# Patient Record
Sex: Female | Born: 1995 | Race: Black or African American | Hispanic: No | Marital: Single | Smoking: Never smoker
Health system: Southern US, Community
[De-identification: ages and names within clinical notes are randomized; demographics above are authoritative.]

## PROBLEM LIST (undated history)

## (undated) DIAGNOSIS — J45909 Unspecified asthma, uncomplicated: Secondary | ICD-10-CM

---
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
## 2017-09-23 ENCOUNTER — Encounter (HOSPITAL_COMMUNITY): Payer: Self-pay | Admitting: Emergency Medicine

## 2017-09-23 ENCOUNTER — Emergency Department (HOSPITAL_COMMUNITY)
Admission: EM | Admit: 2017-09-23 | Discharge: 2017-09-23 | Disposition: A | Discharge status: Home / Self Care | Payer: Self-pay | Attending: Emergency Medicine | Admitting: Emergency Medicine

## 2017-09-23 DIAGNOSIS — K0889 Other specified disorders of teeth and supporting structures: Secondary | ICD-10-CM | POA: Insufficient documentation

## 2017-09-23 DIAGNOSIS — J45909 Unspecified asthma, uncomplicated: Secondary | ICD-10-CM | POA: Insufficient documentation

## 2017-09-23 DIAGNOSIS — J029 Acute pharyngitis, unspecified: Secondary | ICD-10-CM | POA: Insufficient documentation

## 2017-09-23 LAB — RAPID STREP SCREEN (MED CTR MEBANE ONLY): Streptococcus, Group A Screen (Direct): NEGATIVE

## 2017-09-23 MED ORDER — DEXAMETHASONE SODIUM PHOSPHATE 10 MG/ML IJ SOLN
10.0000 mg | Freq: Once | INTRAMUSCULAR | Status: AC
  Administered 2017-09-23: 10 mg via INTRAMUSCULAR
  Filled 2017-09-23: qty 1

## 2017-09-23 MED ORDER — PENICILLIN V POTASSIUM 500 MG PO TABS: 500.0000 mg | ORAL_TABLET | Freq: Four times a day (QID) | ORAL | 0 refills | Status: AC

## 2017-09-23 NOTE — ED Notes (Signed)
Pt departed in NAD, refused use of wheelchair.  

## 2017-09-23 NOTE — ED Triage Notes (Signed)
Pt to ER with complaint of sore throat x2 weeks, tonsils are enlarged and reddened. Also reports dental pain to two back molars.

## 2017-09-23 NOTE — ED Provider Notes (Signed)
[facility]-EMERGENCY DEPT Provider Note   CSN: [id number] Arrival date & time: 09/23/17  1748     History   Chief Complaint Chief Complaint  Patient presents with  . Sore Throat    HPI Jeanette Mcneil is a 21 y.o. female, with no significant past medical history, presents to ED for evaluation of sore throat for the past 2 weeks. She also has been having sharp pain in her back wisdom teeth on the lower gumline for the past several months. She was told by her dentist that she needs to have these removed however, her parents do not want her to. States that she also been having rhinorrhea. She denies any sick contacts with similar symptoms, ever, trouble breathing, trouble swallowing, lip swelling, neck pain, rashes. She has not tried any medications to help with pain, however she states that she has had some left over penicillin tablets that she took for a day.  HPI    History reviewed. No pertinent past medical history.  Patient Active Problem List   Diagnosis Date Noted  . ALLERGIC RHINITIS WITH CONJUNCTIVITIS 06/16/2008  . ASTHMA 06/16/2008  . ECZEMA 06/16/2008    History reviewed. No pertinent surgical history.  OB History    No data available       Home Medications    Prior to Admission medications   Medication Sig Start Date End Date Taking? Authorizing Provider  albuterol (PROVENTIL HFA;VENTOLIN HFA) 108 (90 BASE) MCG/ACT inhaler Inhale 1-2 puffs into the lungs every 6 (six) hours as needed for wheezing or shortness of breath. 03/14/14   Loren Racer, MD  ibuprofen (ADVIL,MOTRIN) 600 MG tablet Take 1 tablet (600 mg total) by mouth 3 (three) times daily after meals. 03/14/14   Loren Racer, MD  penicillin v potassium (VEETID) 500 MG tablet Take 1 tablet (500 mg total) by mouth 4 (four) times daily. 09/23/17 09/30/17  Dietrich Pates, PA-C    Family History No family history on file.  Social History Social History  Substance Use Topics  . Smoking status: Never  Smoker  . Smokeless tobacco: Never Used  . Alcohol use No     Allergies   Patient has no known allergies.   Review of Systems Review of Systems  Constitutional: Negative for chills and fever.  HENT: Positive for dental problem, ear pain, rhinorrhea and sore throat. Negative for congestion, ear discharge, facial swelling, hearing loss, mouth sores, sinus pressure and trouble swallowing.   Eyes: Negative for itching.  Respiratory: Negative for cough, shortness of breath and wheezing.   Gastrointestinal: Negative for vomiting.  Musculoskeletal: Negative for neck pain and neck stiffness.  Skin: Negative for rash.     Physical Exam Updated Vital Signs BP 112/77 (BP Location: Left Arm)   Pulse 79   Temp 97.7 F (36.5 C) (Oral)   Resp 16   SpO2 97%   Physical Exam  Constitutional: She appears well-developed and well-nourished. No distress.  HENT:  Head: Normocephalic and atraumatic.  Mouth/Throat: Uvula is midline. Posterior oropharyngeal edema and posterior oropharyngeal erythema present. No tonsillar exudate.    Tenderness to palpation of bilateral wisdom teeth on the lower gumline. No associated abscess or site of drainage noted. Mild posterior oropharyngeal edema and erythema with no tonsillar exudates. Patient does not appear to be in acute distress. No trismus or drooling present. No pooling of secretions. Patient is tolerating secretions and is not in respiratory distress. No neck pain or tenderness to palpation of the neck. Full active and passive range  of motion of the neck. No evidence of RPA or PTA.  Eyes: Conjunctivae and EOM are normal. No scleral icterus.  Neck: Normal range of motion.  Cardiovascular: Normal rate, regular rhythm and normal heart sounds.   Pulmonary/Chest: Effort normal and breath sounds normal. No respiratory distress.  Neurological: She is alert.  Skin: No rash noted. She is not diaphoretic.  Psychiatric: She has a normal mood and affect.    Nursing note and vitals reviewed.    ED Treatments / Results  Labs (all labs ordered are listed, but only abnormal results are displayed) Labs Reviewed  RAPID STREP SCREEN (NOT AT [facility])  CULTURE, GROUP A STREP [facility])    EKG  EKG Interpretation None       Radiology No results found.  Procedures Procedures (including critical care time)  Medications Ordered in ED Medications  dexamethasone (DECADRON) injection 10 mg (10 mg Intramuscular Given 09/23/17 2003)     Initial Impression / Assessment and Plan / ED Course  I have reviewed the triage vital signs and the nursing notes.  Pertinent labs & imaging results that were available during my care of the patient were reviewed by me and considered in my medical decision making (see chart for details).     Patient presents to ED for evaluation of sore throat for 2 weeks and toothache on bilateral bottom wisdom teeth that have been going on for the past month. States that her wisdom teeth have erupted several months ago by her parents do not want to get them removed. She denies any trouble breathing, trouble swallowing, fevers, cough. She does report associated rhinorrhea. She has not tried any pain medications but did take some of her left over penicillin tablets for 1 day. On physical exam she is nontoxic appearing and in no acute distress. She is tolerating secretions with no trismus or drooling. There is mild posterior oropharyngeal erythema and edema but no tonsillar exudates. No visible dental abscesses. Low suspicion for RPA, PTA or Ludwig's angina as the cause of her symptoms today based on physical exam findings. I suspect a viral pharyngitis as a cause of her sore throat and early dental infection. Will give Decadron here and discharge with penicillin for dental infection, and advise follow up with dentist. NSAIDs as needed for pain. Patient appears stable for discharge at this time. Strict return precautions given.  Final  Clinical Impressions(s) / ED Diagnoses   Final diagnoses:  Viral pharyngitis  Pain, dental    New Prescriptions New Prescriptions   PENICILLIN V POTASSIUM (VEETID) 500 MG TABLET    Take 1 tablet (500 mg total) by mouth 4 (four) times daily.     Dietrich Pates, PA-C 09/23/17 2037    Eber Hong, MD 09/26/17 [phone]

## 2017-09-23 NOTE — Discharge Instructions (Signed)
Please read attached information regarding your condition. Take penicillin 4 times daily for 1 week. Take ibuprofen or Tylenol as needed for pain. Follow-up with dentist listed below for further evaluation. Return to ED for worsening sore throat, trouble breathing, trouble swallowing, lip swelling, trouble moving neck.

## 2017-09-26 LAB — CULTURE, GROUP A STREP (THRC)

## 2017-11-03 ENCOUNTER — Other Ambulatory Visit: Payer: Self-pay

## 2017-11-03 ENCOUNTER — Encounter (HOSPITAL_COMMUNITY): Payer: Self-pay | Admitting: Oncology

## 2017-11-03 ENCOUNTER — Emergency Department (HOSPITAL_COMMUNITY)
Admission: EM | Admit: 2017-11-03 | Discharge: 2017-11-04 | Disposition: A | Discharge status: Home / Self Care | Payer: Medicaid Other | Attending: Emergency Medicine | Admitting: Emergency Medicine

## 2017-11-03 DIAGNOSIS — J45909 Unspecified asthma, uncomplicated: Secondary | ICD-10-CM | POA: Insufficient documentation

## 2017-11-03 DIAGNOSIS — Z79899 Other long term (current) drug therapy: Secondary | ICD-10-CM | POA: Insufficient documentation

## 2017-11-03 DIAGNOSIS — F410 Panic disorder [episodic paroxysmal anxiety] without agoraphobia: Secondary | ICD-10-CM | POA: Insufficient documentation

## 2017-11-03 DIAGNOSIS — F419 Anxiety disorder, unspecified: Secondary | ICD-10-CM | POA: Insufficient documentation

## 2017-11-03 DIAGNOSIS — E876 Hypokalemia: Secondary | ICD-10-CM | POA: Insufficient documentation

## 2017-11-03 HISTORY — DX: Unspecified asthma, uncomplicated: J45.909

## 2017-11-03 LAB — I-STAT BETA HCG BLOOD, ED (MC, WL, AP ONLY)

## 2017-11-03 LAB — CBC
HCT: 38.2 % (ref 36.0–46.0)
HEMOGLOBIN: 13.5 g/dL (ref 12.0–15.0)
MCH: 29.7 pg (ref 26.0–34.0)
MCHC: 35.3 g/dL (ref 30.0–36.0)
MCV: 84 fL (ref 78.0–100.0)
PLATELETS: 340 10*3/uL (ref 150–400)
RBC: 4.55 MIL/uL (ref 3.87–5.11)
RDW: 12.5 % (ref 11.5–15.5)
WBC: 10.5 10*3/uL (ref 4.0–10.5)

## 2017-11-03 MED ORDER — ASPIRIN 81 MG PO CHEW
324.0000 mg | CHEWABLE_TABLET | Freq: Once | ORAL | Status: AC
  Administered 2017-11-04: 324 mg via ORAL
  Filled 2017-11-03: qty 4

## 2017-11-03 NOTE — ED Provider Notes (Signed)
[facility] Provider Note   CSN: [id number] Arrival date & time: 11/03/17  2319     History   Chief Complaint Chief Complaint  Patient presents with  . Panic Attack    HPI Jeanette Mcneil is a 21 y.o. female with a hx of asthma presents to the Emergency Department complaining of gradual, persistent, progressively worsening chest pain and SOB onset approx 10:30pm.  Patient reports she was "just sitting there"when the symptoms began.  Per EMS, patient hyperventilating upon their arrival and throughout their entire time.  Mild tachycardia but otherwise normal vital signs.  Prehospital ECG without acute abnormalities.  Patient refuses to discuss the events surrounding the onset of her chest pain.  She answers few questions for me, refusing to make eye contact.   The history is provided by the patient and medical records. No language interpreter was used.    Past Medical History:  Diagnosis Date  . Asthma     Patient Active Problem List   Diagnosis Date Noted  . ALLERGIC RHINITIS WITH CONJUNCTIVITIS 06/16/2008  . ASTHMA 06/16/2008  . ECZEMA 06/16/2008    History reviewed. No pertinent surgical history.  OB History    No data available       Home Medications    Prior to Admission medications   Medication Sig Start Date End Date Taking? Authorizing Provider  albuterol (PROVENTIL HFA;VENTOLIN HFA) 108 (90 BASE) MCG/ACT inhaler Inhale 1-2 puffs into the lungs every 6 (six) hours as needed for wheezing or shortness of breath. 03/14/14   Loren Racer, MD  ibuprofen (ADVIL,MOTRIN) 600 MG tablet Take 1 tablet (600 mg total) by mouth 3 (three) times daily after meals. 03/14/14   Loren Racer, MD  potassium chloride SA (K-DUR,KLOR-CON) 20 MEQ tablet Take 1 tablet (20 mEq total) daily by mouth. 11/04/17   Prue Lingenfelter, Dahlia Client, PA-C    Family History No family history on file.  Social History Social History   Tobacco Use  .  Smoking status: Never Smoker  . Smokeless tobacco: Never Used  Substance Use Topics  . Alcohol use: No  . Drug use: Yes    Types: Marijuana     Allergies   Patient has no known allergies.   Review of Systems Review of Systems  Constitutional: Negative for appetite change, diaphoresis, fatigue, fever and unexpected weight change.  HENT: Negative for mouth sores.   Eyes: Negative for visual disturbance.  Respiratory: Positive for chest tightness and shortness of breath. Negative for cough and wheezing.   Cardiovascular: Positive for chest pain.  Gastrointestinal: Negative for abdominal pain, constipation, diarrhea, nausea and vomiting.  Endocrine: Negative for polydipsia, polyphagia and polyuria.  Genitourinary: Negative for dysuria, frequency, hematuria and urgency.  Musculoskeletal: Negative for back pain and neck stiffness.  Skin: Negative for rash.  Allergic/Immunologic: Negative for immunocompromised state.  Neurological: Negative for syncope, light-headedness and headaches.  Hematological: Does not bruise/bleed easily.  Psychiatric/Behavioral: Negative for sleep disturbance. The patient is not nervous/anxious.      Physical Exam Updated Vital Signs BP 106/73   Pulse 81   Temp 98 F (36.7 C) (Oral)   Resp 17   Ht 5' (1.524 m)   Wt 51.3 kg (113 lb)   LMP 11/03/2017 (Exact Date)   SpO2 99%   BMI 22.07 kg/m   Physical Exam  Constitutional: She appears well-developed and well-nourished. No distress.  Awake, alert, nontoxic appearance Patient is crying and hyperventilating  HENT:  Head: Normocephalic and atraumatic.  Mouth/Throat:  Oropharynx is clear and moist. No oropharyngeal exudate.  Eyes: Conjunctivae are normal. No scleral icterus.  Neck: Normal range of motion. Neck supple.  Cardiovascular: Regular rhythm and intact distal pulses. Tachycardia present.  Pulses:      Radial pulses are 2+ on the right side, and 2+ on the left side.  Pulmonary/Chest: Effort  normal and breath sounds normal. Tachypnea noted. No respiratory distress. She has no wheezes.  Equal chest expansion  Abdominal: Soft. Bowel sounds are normal. She exhibits no mass. There is no tenderness. There is no rebound and no guarding.  Musculoskeletal: Normal range of motion. She exhibits no edema.  No peripheral edema  Neurological: She is alert.  Speech is clear and goal oriented Moves extremities without ataxia  Skin: Skin is warm and dry. She is not diaphoretic.  Psychiatric: Her mood appears anxious.  Nursing note and vitals reviewed.    ED Treatments / Results  Labs (all labs ordered are listed, but only abnormal results are displayed) Labs Reviewed  BASIC METABOLIC PANEL - Abnormal; Notable for the following components:      Result Value   Potassium 2.7 (*)    CO2 18 (*)    All other components within normal limits  URINALYSIS, ROUTINE W REFLEX MICROSCOPIC - Abnormal; Notable for the following components:   APPearance HAZY (*)    Hgb urine dipstick LARGE (*)    Bacteria, UA RARE (*)    Squamous Epithelial / LPF 0-5 (*)    All other components within normal limits  CBC  RAPID URINE DRUG SCREEN, HOSP PERFORMED  I-STAT BETA HCG BLOOD, ED ([facility], [facility], [facility] ONLY)  I-STAT TROPONIN, ED    EKG  EKG Interpretation  Date/Time:  Sunday November 03 2017 23:31:06 EST Ventricular Rate:  118 PR Interval:    QRS Duration: 75 QT Interval:  334 QTC Calculation: 468 R Axis:   73 Text Interpretation:  Sinus tachycardia Low voltage, precordial leads Borderline repolarization abnormality Baseline wander in lead(s) II III aVF Confirmed by Gilda CreasePollina, Christopher J [phone] on 11/04/2017 1:24:41 AM        Procedures Procedures (including critical care time)  Medications Ordered in ED Medications  aspirin chewable tablet 324 mg (324 mg Oral Given 11/04/17 0117)  potassium chloride SA (K-DUR,KLOR-CON) CR tablet 80 mEq (80 mEq Oral Given 11/04/17 0117)     Initial Impression /  Assessment and Plan / ED Course  I have reviewed the triage vital signs and the nursing notes.  Pertinent labs & imaging results that were available during my care of the patient were reviewed by me and considered in my medical decision making (see chart for details).  Clinical Course as of Nov 04 349  Mon Nov 04, 2017  0100 Patient is significantly calmer now.  Tachycardia has resolved, tachypnea resolved.  Patient reports no persistent chest pain.  She again denies inciting events however seems to become upset every time she is on her phone.  [HM]  0347 Potassium given in the ED Potassium: (!!) 2.7 [HM]  0347 Pt reports she is on her menses.  Denies all urinary symptoms Hgb urine dipstick: (!) LARGE [HM]    Clinical Course User Index [HM] Imajean Mcdermid, Dahlia ClientHannah, PA-C    Patient presents to the emergency department complaining of symptoms consistent with anxiety and panic attack tonight.  At discharge, the patient is resting comfortably, in no apparent distress and asymptomatic.  Labs, ECG and vital signs reviewed.  No exophthalmos, pregnancy test negative, no signs  of UTI.  Hypokalemia noted and replacement was begunin the ED.  Stress reducing mechanisms discussed. She is otherwise healthy.  Symptoms have resolved.  Doubt ACS, PE.  Clear and equal breath sounds.     Final Clinical Impressions(s) / ED Diagnoses   Final diagnoses:  Panic attack  Hypokalemia    ED Discharge Orders        Ordered    potassium chloride SA (K-DUR,KLOR-CON) 20 MEQ tablet  Daily     11/04/17 0302       Tico Crotteau, Dahlia ClientHannah, PA-C 11/04/17 0350    Gilda CreasePollina, Christopher J, MD 11/04/17 [phone]

## 2017-11-03 NOTE — ED Triage Notes (Signed)
Pt bib [facility] from home d/t panic attack.  Pt is hysterical will not speak to staff.  Pt continues to text on her phone. Per EMS pt has a personal situation that seems to be exacerbated with each text message.

## 2017-11-04 LAB — BASIC METABOLIC PANEL
Anion gap: 13 (ref 5–15)
BUN: 8 mg/dL (ref 6–20)
CO2: 18 mmol/L — ABNORMAL LOW (ref 22–32)
CREATININE: 0.76 mg/dL (ref 0.44–1.00)
Calcium: 9.6 mg/dL (ref 8.9–10.3)
Chloride: 105 mmol/L (ref 101–111)
GFR calc Af Amer: >60 mL/min (ref >60)
GLUCOSE: 93 mg/dL (ref 65–99)
POTASSIUM: 2.7 mmol/L — AB (ref 3.5–5.1)
SODIUM: 136 mmol/L (ref 135–145)

## 2017-11-04 LAB — RAPID URINE DRUG SCREEN, HOSP PERFORMED
AMPHETAMINES: NOT DETECTED
Barbiturates: NOT DETECTED
Benzodiazepines: NOT DETECTED
Cocaine: NOT DETECTED
OPIATES: NOT DETECTED
Tetrahydrocannabinol: NOT DETECTED

## 2017-11-04 LAB — URINALYSIS, ROUTINE W REFLEX MICROSCOPIC
Bilirubin Urine: NEGATIVE
Glucose, UA: NEGATIVE mg/dL
Ketones, ur: NEGATIVE mg/dL
Leukocytes, UA: NEGATIVE
Nitrite: NEGATIVE
Protein, ur: NEGATIVE mg/dL
Specific Gravity, Urine: 1.02 (ref 1.005–1.030)
pH: 8 (ref 5.0–8.0)

## 2017-11-04 MED ORDER — POTASSIUM CHLORIDE CRYS ER 20 MEQ PO TBCR: 20.0000 meq | EXTENDED_RELEASE_TABLET | Freq: Every day | ORAL | 0 refills | Status: DC

## 2017-11-04 MED ORDER — POTASSIUM CHLORIDE CRYS ER 20 MEQ PO TBCR
80.0000 meq | EXTENDED_RELEASE_TABLET | Freq: Once | ORAL | Status: AC
  Administered 2017-11-04: 80 meq via ORAL
  Filled 2017-11-04: qty 4

## 2017-11-04 NOTE — ED Notes (Signed)
Critical K+ of 2.7 reported to HitchcockHannah, Georgia[place].

## 2017-11-04 NOTE — ED Notes (Signed)
Pt used the restroom to void.  Was given specimen cup however stated she did not have specimen cup to provide sample in.  Dahlia ClientHannah, [place] is aware.  Will attempt to collect specimen again.

## 2017-11-04 NOTE — Discharge Instructions (Signed)
1. Medications: Klor Con (potassium), usual home medications 2. Treatment: rest, drink plenty of fluids,  3. Follow Up: Please followup with your primary doctor in 2-3 days for discussion of your diagnoses and further evaluation after today's visit; if you do not have a primary care doctor use the resource guide provided to find one; Please return to the ER for return or worsening symptoms

## 2019-02-10 ENCOUNTER — Emergency Department (HOSPITAL_BASED_OUTPATIENT_CLINIC_OR_DEPARTMENT_OTHER)
Admission: EM | Admit: 2019-02-10 | Discharge: 2019-02-10 | Disposition: A | Discharge status: Home / Self Care | Payer: Medicaid Other | Attending: Emergency Medicine | Admitting: Emergency Medicine

## 2019-02-10 ENCOUNTER — Other Ambulatory Visit: Payer: Self-pay

## 2019-02-10 ENCOUNTER — Encounter (HOSPITAL_BASED_OUTPATIENT_CLINIC_OR_DEPARTMENT_OTHER): Payer: Self-pay | Admitting: *Deleted

## 2019-02-10 DIAGNOSIS — O26891 Other specified pregnancy related conditions, first trimester: Secondary | ICD-10-CM | POA: Diagnosis not present

## 2019-02-10 DIAGNOSIS — F1210 Cannabis abuse, uncomplicated: Secondary | ICD-10-CM | POA: Insufficient documentation

## 2019-02-10 DIAGNOSIS — Z79899 Other long term (current) drug therapy: Secondary | ICD-10-CM | POA: Diagnosis not present

## 2019-02-10 DIAGNOSIS — Z3A08 8 weeks gestation of pregnancy: Secondary | ICD-10-CM | POA: Diagnosis not present

## 2019-02-10 DIAGNOSIS — J45909 Unspecified asthma, uncomplicated: Secondary | ICD-10-CM | POA: Diagnosis not present

## 2019-02-10 DIAGNOSIS — B9689 Other specified bacterial agents as the cause of diseases classified elsewhere: Secondary | ICD-10-CM | POA: Diagnosis not present

## 2019-02-10 DIAGNOSIS — N898 Other specified noninflammatory disorders of vagina: Secondary | ICD-10-CM | POA: Diagnosis not present

## 2019-02-10 DIAGNOSIS — N760 Acute vaginitis: Secondary | ICD-10-CM | POA: Insufficient documentation

## 2019-02-10 LAB — URINALYSIS, ROUTINE W REFLEX MICROSCOPIC
Bilirubin Urine: NEGATIVE
Glucose, UA: NEGATIVE mg/dL
Hgb urine dipstick: NEGATIVE
Ketones, ur: 15 mg/dL — AB
Nitrite: NEGATIVE
PH: 7.5 (ref 5.0–8.0)
Protein, ur: NEGATIVE mg/dL
Specific Gravity, Urine: 1.025 (ref 1.005–1.030)

## 2019-02-10 LAB — WET PREP, GENITAL
SPERM: NONE SEEN
Trich, Wet Prep: NONE SEEN
Yeast Wet Prep HPF POC: NONE SEEN

## 2019-02-10 LAB — URINALYSIS, MICROSCOPIC (REFLEX)

## 2019-02-10 LAB — PREGNANCY, URINE: Preg Test, Ur: POSITIVE — AB

## 2019-02-10 MED ORDER — METRONIDAZOLE 500 MG PO TABS: 500.0000 mg | ORAL_TABLET | Freq: Two times a day (BID) | ORAL | 0 refills | Status: AC

## 2019-02-10 NOTE — ED Provider Notes (Addendum)
[facility] Provider Note: Jeanette Dell, MD, FACEP  CSN: [id number] MRN: [id number] ARRIVAL: 02/10/19 at 2008 ROOM: [facility]02/[facility]02   CHIEF COMPLAINT  Vaginal Discharge   HISTORY OF PRESENT ILLNESS  02/10/19 11:23 PM Jeanette Mcneil is a 23 y.o. female who is about [redacted] weeks pregnant.  She is here with a vaginal discharge that has been intermittent over the past week.  She describes the discharge is white, not copious, but having an abnormal odor.  She has had no vaginal bleeding.  She has had no dysuria.  She has had some sharp, fleeting pelvic pains in multiple locations over the past 2 days.  These occur only several times a day and are not constant.  She attempted to get in to her OB/GYN but they could not work her in for several days.    Past Medical History:  Diagnosis Date  . Asthma     History reviewed. No pertinent surgical history.  History reviewed. No pertinent family history.  Social History   Tobacco Use  . Smoking status: Never Smoker  . Smokeless tobacco: Never Used  Substance Use Topics  . Alcohol use: No  . Drug use: Yes    Types: Marijuana    Prior to Admission medications   Medication Sig Start Date End Date Taking? Authorizing Provider  prenatal vitamin w/FE, FA (PRENATAL 1 + 1) 27-1 MG TABS tablet Take 1 tablet by mouth daily at 12 noon.   Yes [provider]  albuterol (PROVENTIL HFA;VENTOLIN HFA) 108 (90 BASE) MCG/ACT inhaler Inhale 1-2 puffs into the lungs every 6 (six) hours as needed for wheezing or shortness of breath. 03/14/14   Loren Racer, MD  ibuprofen (ADVIL,MOTRIN) 600 MG tablet Take 1 tablet (600 mg total) by mouth 3 (three) times daily after meals. 03/14/14   Loren Racer, MD  potassium chloride SA (K-DUR,KLOR-CON) 20 MEQ tablet Take 1 tablet (20 mEq total) daily by mouth. 11/04/17   Muthersbaugh, Dahlia Client, PA-C    Allergies Patient has no known allergies.   REVIEW OF SYSTEMS  Negative except as noted here or  in the History of Present Illness.   PHYSICAL EXAMINATION  Initial Vital Signs Blood pressure 132/77, pulse 84, temperature 98.3 F (36.8 C), temperature source Oral, resp. rate 18, height 5' (1.524 m), weight 50.3 kg, SpO2 99 %.  Examination General: Well-developed, well-nourished female in no acute distress; appearance consistent with age of record HENT: normocephalic; atraumatic Eyes: pupils equal, round and reactive to light; extraocular muscles intact Neck: supple Heart: regular rate and rhythm Lungs: clear to auscultation bilaterally Abdomen: soft; nondistended; nontender; no masses or hepatosplenomegaly; bowel sounds present GU: Normal external genitalia; white vaginal discharge; no vaginal bleeding; no cervical motion tenderness; no adnexal tenderness Extremities: No deformity; full range of motion; pulses normal Neurologic: Awake, alert and oriented; motor function intact in all extremities and symmetric; no facial droop Skin: Warm and dry Psychiatric: Normal mood and affect   RESULTS  Summary of this visit's results, reviewed by myself:   EKG Interpretation  Date/Time:    Ventricular Rate:    PR Interval:    QRS Duration:   QT Interval:    QTC Calculation:   R Axis:     Text Interpretation:        Laboratory Studies: Results for orders placed or performed during the hospital encounter of 02/10/19 (from the past 24 hour(s))  Urinalysis, Routine w reflex microscopic     Status: Abnormal   Collection Time: 02/10/19  8:10 PM  Result Value Ref Range   Color, Urine YELLOW YELLOW   APPearance HAZY (A) CLEAR   Specific Gravity, Urine 1.025 1.005 - 1.030   pH 7.5 5.0 - 8.0   Glucose, UA NEGATIVE NEGATIVE mg/dL   Hgb urine dipstick NEGATIVE NEGATIVE   Bilirubin Urine NEGATIVE NEGATIVE   Ketones, ur 15 (A) NEGATIVE mg/dL   Protein, ur NEGATIVE NEGATIVE mg/dL   Nitrite NEGATIVE NEGATIVE   Leukocytes,Ua TRACE (A) NEGATIVE  Pregnancy, urine     Status: Abnormal    Collection Time: 02/10/19  8:10 PM  Result Value Ref Range   Preg Test, Ur POSITIVE (A) NEGATIVE  Urinalysis, Microscopic (reflex)     Status: Abnormal   Collection Time: 02/10/19  8:10 PM  Result Value Ref Range   RBC / HPF 0-5 0 - 5 RBC/hpf   WBC, UA 6-10 0 - 5 WBC/hpf   Bacteria, UA RARE (A) NONE SEEN   Squamous Epithelial / LPF 6-10 0 - 5  Wet prep, genital     Status: Abnormal   Collection Time: 02/10/19  8:18 PM  Result Value Ref Range   Yeast Wet Prep HPF POC NONE SEEN NONE SEEN   Trich, Wet Prep NONE SEEN NONE SEEN   Clue Cells Wet Prep HPF POC PRESENT (A) NONE SEEN   WBC, Wet Prep HPF POC MANY (A) NONE SEEN   Sperm NONE SEEN    Imaging Studies: No results found.  ED COURSE and MDM  Nursing notes and initial vitals signs, including pulse oximetry, reviewed.  Vitals:   02/10/19 2011 02/10/19 2242  BP:  132/77  Pulse:  84  Resp:  18  Temp:  98.3 F (36.8 C)  TempSrc:  Oral  SpO2:  99%  Weight: 50.8 kg 50.3 kg  Height: 5' (1.524 m) 5' (1.524 m)   Symptoms and wet prep consistent with bacterial vaginosis.  We will treat with Flagyl.  She will follow-up with her OB/GYN as scheduled.  PROCEDURES    ED DIAGNOSES     ICD-10-CM   1. BV (bacterial vaginosis) N76.0    B96.89        Lancer Thurner, MD 02/10/19 2352    Paula Libra, MD 02/10/19 2356

## 2019-02-10 NOTE — ED Notes (Signed)
ED Provider at bedside. 

## 2019-02-10 NOTE — ED Triage Notes (Signed)
Pt c/o vaginal discharge x 1 week , Pt is 8 weeks preg

## 2019-02-12 LAB — GC/CHLAMYDIA PROBE AMP (~~LOC~~) NOT AT ARMC
Chlamydia: POSITIVE — AB
Chlamydia: POSITIVE — AB
Neisseria Gonorrhea: NEGATIVE
Neisseria Gonorrhea: NEGATIVE
